# Patient Record
Sex: Female | Born: 1980 | Race: Black or African American | Marital: Single | Smoking: Never smoker
Health system: Southern US, Community
[De-identification: ages and names within clinical notes are randomized; demographics above are authoritative.]

## PROBLEM LIST (undated history)

## (undated) ENCOUNTER — Emergency Department (HOSPITAL_COMMUNITY): Admission: EM | Payer: Self-pay | Source: Home / Self Care

## (undated) HISTORY — PX: KNEE SURGERY: SHX244

---

## 2014-07-20 ENCOUNTER — Emergency Department (HOSPITAL_COMMUNITY)
Admission: EM | Admit: 2014-07-20 | Discharge: 2014-07-20 | Disposition: A | Discharge status: Home / Self Care | Attending: Emergency Medicine | Admitting: Emergency Medicine

## 2014-07-20 DIAGNOSIS — H6123 Impacted cerumen, bilateral: Secondary | ICD-10-CM | POA: Diagnosis not present

## 2014-07-20 DIAGNOSIS — H938X1 Other specified disorders of right ear: Secondary | ICD-10-CM

## 2014-07-20 DIAGNOSIS — H9201 Otalgia, right ear: Secondary | ICD-10-CM | POA: Diagnosis present

## 2014-07-20 MED ORDER — DOCUSATE SODIUM 50 MG/5ML PO LIQD
200.0000 mg | Freq: Once | ORAL | Status: AC
  Administered 2014-07-20: 200 mg via OTIC
  Filled 2014-07-20: qty 20

## 2014-07-20 NOTE — ED Provider Notes (Signed)
CSN: [id number]     Arrival date & time 07/20/14  0358 History   None    No chief complaint on file.    (Consider location/radiation/quality/duration/timing/severity/associated sxs/prior Treatment) Patient is a 34 y.o. female presenting with ear pain. The history is provided by the patient.  Otalgia Location:  Right Quality:  Aching Severity:  Mild Onset quality:  Gradual Duration:  12 hours Timing:  Constant Progression:  Unchanged Chronicity:  New Context: not direct blow and not elevation change   Relieved by:  Nothing Worsened by:  Nothing tried Associated symptoms: no cough and no fever     No past medical history on file. No past surgical history on file. No family history on file. History  Substance Use Topics  . Smoking status: Not on file  . Smokeless tobacco: Not on file  . Alcohol Use: Not on file   OB History    No data available     Review of Systems  Constitutional: Negative for fever.  HENT: Positive for ear pain.   Respiratory: Negative for cough and shortness of breath.   All other systems reviewed and are negative.     Allergies  Review of patient's allergies indicates no known allergies.  Home Medications   Prior to Admission medications   Not on File   There were no vitals taken for this visit. Physical Exam  Constitutional: She is oriented to person, place, and time. She appears well-developed and well-nourished. No distress.  HENT:  Head: Normocephalic and atraumatic.  Mouth/Throat: Oropharynx is clear and moist.  Bilateral ears impacted with cerumen  Eyes: EOM are normal. Pupils are equal, round, and reactive to light.  Neck: Normal range of motion. Neck supple.  Cardiovascular: Normal rate and regular rhythm.  Exam reveals no friction rub.   No murmur heard. Pulmonary/Chest: Effort normal and breath sounds normal. No respiratory distress. She has no wheezes. She has no rales.  Abdominal: Soft. She exhibits no distension. There  is no tenderness. There is no rebound.  Musculoskeletal: Normal range of motion. She exhibits no edema.  Neurological: She is alert and oriented to person, place, and time.  Skin: She is not diaphoretic.  Nursing note and vitals reviewed.   ED Course  Procedures (including critical care time) Labs Review Labs Reviewed - No data to display  Imaging Review No results found.   EKG Interpretation None      MDM   Final diagnoses:  Ear fullness, right    55F here with R ear pain - impacted with cerumen on exam. L ear filled with cerumen also. No fevers, no mastoid tenderness, no URI symptoms. Colace applied and then irrigated. Minimal wax removal, however she reported her ear popped, which is what she wanted. She wants to leave, can get her ears cleaned out by her PCP.  Elwin MochaBlair Brinkley Peet, MD 07/20/14 [phone]

## 2016-01-26 ENCOUNTER — Emergency Department (HOSPITAL_COMMUNITY)
Admission: EM | Admit: 2016-01-26 | Discharge: 2016-01-26 | Disposition: A | Discharge status: Home / Self Care | Attending: Emergency Medicine | Admitting: Emergency Medicine

## 2016-01-26 ENCOUNTER — Emergency Department (HOSPITAL_COMMUNITY)

## 2016-01-26 ENCOUNTER — Encounter (HOSPITAL_COMMUNITY): Payer: Self-pay | Admitting: Emergency Medicine

## 2016-01-26 DIAGNOSIS — R1011 Right upper quadrant pain: Secondary | ICD-10-CM | POA: Diagnosis not present

## 2016-01-26 DIAGNOSIS — R1031 Right lower quadrant pain: Secondary | ICD-10-CM | POA: Diagnosis present

## 2016-01-26 LAB — LIPASE, BLOOD: Lipase: 14 U/L (ref 11–51)

## 2016-01-26 LAB — I-STAT BETA HCG BLOOD, ED (MC, WL, AP ONLY): I-stat hCG, quantitative: <5 m[IU]/mL (ref <5)

## 2016-01-26 MED ORDER — ONDANSETRON HCL 4 MG/2ML IJ SOLN: 4.0000 mg | Freq: Once | INTRAMUSCULAR | Status: DC

## 2016-01-26 MED ORDER — MORPHINE SULFATE (PF) 4 MG/ML IV SOLN: 4.0000 mg | Freq: Once | INTRAVENOUS | Status: DC

## 2016-01-26 MED ORDER — IOPAMIDOL (ISOVUE-300) INJECTION 61%
100.0000 mL | Freq: Once | INTRAVENOUS | Status: AC | PRN
  Administered 2016-01-26: 100 mL via INTRAVENOUS

## 2016-01-26 MED ORDER — SODIUM CHLORIDE 0.9 % IV BOLUS (SEPSIS)
1000.0000 mL | Freq: Once | INTRAVENOUS | Status: AC
  Administered 2016-01-26: 1000 mL via INTRAVENOUS

## 2016-01-26 NOTE — ED Notes (Signed)
Pt states she was seen at her PCP today for periumbilical abdominal pain and was found to have an elevated WBC. States the PCP was worried for appendicitis and was told to come to the ED if her pain progressed. Pain worsens when lying on R side. Alert and oriented.

## 2016-01-26 NOTE — ED Provider Notes (Signed)
CSN: [id number]     Arrival date & time 01/26/16  0100 History  By signing my name below, I, Emmanuella Mensah, attest that this documentation has been prepared under the direction and in the presence of Melene Plan, DO. Electronically Signed: Angelene Giovanni, ED Scribe. 01/26/2016. 1:32 AM.    Chief Complaint  Patient presents with  . Abdominal Pain   Patient is a 35 y.o. female presenting with abdominal pain. The history is provided by the patient. No language interpreter was used.  Abdominal Pain Pain location:  Periumbilical and RLQ Pain severity:  Moderate Onset quality:  Gradual Timing:  Intermittent Progression:  Worsening Chronicity:  New Relieved by:  None tried Worsened by:  Nothing tried Ineffective treatments:  None tried Associated symptoms: no chest pain, no chills, no diarrhea, no dysuria, no fever, no nausea, no shortness of breath and no vomiting    HPI Comments: Meredith Madden is a 35 y.o. female who presents to the Emergency Department complaining of gradually worsening intermittent mid and RLQ abdominal pain onset several days ago. Pt reports associated decreased in appetite. She adds that the pain shifts throughout her abdomen depending on her position. She states that laying down or sitting up makes the pain worse. Pt was seen by her PCP and advised to come to the ED with concerns for appendicitis. She states that she had a negative pregnancy test there but her WBC was elevated. No alleviating factors noted. Pt has not tried any medications PTA. She denies any recent falls, injuries, or trauma. No sick contacts noted. She denies any fever, chills, or n/v/d.    History reviewed. No pertinent past medical history. Past Surgical History  Procedure Laterality Date  . Knee surgery Right    No family history on file. Social History  Substance Use Topics  . Smoking status: Never Smoker   . Smokeless tobacco: None  . Alcohol Use: No   OB History    No data available      Review of Systems  Constitutional: Negative for fever and chills.  HENT: Negative for congestion and rhinorrhea.   Eyes: Negative for redness and visual disturbance.  Respiratory: Negative for shortness of breath and wheezing.   Cardiovascular: Negative for chest pain and palpitations.  Gastrointestinal: Positive for abdominal pain. Negative for nausea, vomiting and diarrhea.  Genitourinary: Negative for dysuria and urgency.  Musculoskeletal: Negative for myalgias and arthralgias.  Skin: Negative for pallor and wound.  Neurological: Negative for dizziness and headaches.  All other systems reviewed and are negative.     Allergies  Review of patient's allergies indicates no known allergies.  Home Medications   Prior to Admission medications   Not on File   BP 111/72 mmHg  Pulse 75  Temp(Src) 98 F (36.7 C) (Oral)  Resp 20  SpO2 100%  LMP 01/06/2016 (Approximate) Physical Exam  Constitutional: She is oriented to person, place, and time. She appears well-developed and well-nourished. No distress.  HENT:  Head: Normocephalic and atraumatic.  Eyes: EOM are normal. Pupils are equal, round, and reactive to light.  Neck: Normal range of motion. Neck supple.  Cardiovascular: Normal rate, regular rhythm and normal heart sounds.  Exam reveals no gallop and no friction rub.   No murmur heard. Pulmonary/Chest: Effort normal and breath sounds normal. She has no wheezes. She has no rales.  Abdominal: Soft. She exhibits no distension. There is tenderness.  Mild RLQ, otherwise benign exam Negative Murphy's sign and negative psoas  Musculoskeletal: Normal range  of motion. She exhibits no edema or tenderness.  Neurological: She is alert and oriented to person, place, and time.  Skin: Skin is warm and dry. She is not diaphoretic.  Psychiatric: She has a normal mood and affect. Her behavior is normal. Judgment normal.  Nursing note and vitals reviewed.   ED Course  Procedures  (including critical care time) DIAGNOSTIC STUDIES: Oxygen Saturation is 100% on RA, normal by my interpretation.    COORDINATION OF CARE: 1:29 AM- Pt advised of plan for treatment and pt agrees. Pt will receive lab work and CT scan for further evaluation. She will receive IV fluids, Morphine, and Zofran.    Labs Review Labs Reviewed  LIPASE, BLOOD  URINALYSIS, ROUTINE W REFLEX MICROSCOPIC (NOT AT [facility])  I-STAT BETA HCG BLOOD, ED ([facility], [facility], [facility] ONLY)    Imaging Review Ct Abdomen Pelvis W Contrast  01/26/2016  CLINICAL DATA:  Right lower quadrant pain for several days. EXAM: CT ABDOMEN AND PELVIS WITH CONTRAST TECHNIQUE: Multidetector CT imaging of the abdomen and pelvis was performed using the standard protocol following bolus administration of intravenous contrast. CONTRAST:  ISOVUE-300 IOPAMIDOL (ISOVUE-300) INJECTION 61% COMPARISON:  None. FINDINGS: Lower chest and abdominal wall:  No contributory findings. Hepatobiliary: No focal liver abnormality.No evidence of biliary obstruction or stone. Pancreas: Unremarkable. Spleen: Unremarkable. Adrenals/Urinary Tract: Negative adrenals. No hydronephrosis or stone. Unremarkable bladder. Stomach/Bowel:  No obstruction. No appendicitis. Reproductive:Fibroid uterus including intramural fundal fibroid measuring nearly 5 cm. Fibroids are enhancing. No noted endometrial distortion. Vascular/Lymphatic: No acute vascular abnormality. Mild enlargement of mesenteric lymph nodes, reactive appearing and often incidental in isolation. Other: Small pelvic fluid, often physiologic in this setting. Musculoskeletal: No acute abnormalities. IMPRESSION: No acute finding, including appendicitis. Fibroid uterus. Electronically Signed   By: Marnee Spring M.D.   On: 01/26/2016 02:52     Melene Plan, DO has personally reviewed and evaluated these images and lab results as part of his medical decision-making.  MDM   Final diagnoses:  Right upper quadrant pain    35  yo F With a chief complaint of abdominal pain. This been going on for a couple days. Patient has had some anorexia with this. Describes the pain is epigastric and periumbilical. Was seen by her family doctor earlier today who thought that this may be appendicitis and told her if it worsened to come to the ED for a CT scan. Patient with mild right lower quadrant tenderness on exam but no other noted areas of pain. Will obtain a CT scan.  CT scan is negative for acute pathology. On my viewing of the images the patient appears to have a large stool burden.  I discussed that this may not be the etiology, though recommended a clean out and see if her symptoms improved.   I personally performed the services described in this documentation, which was scribed in my presence. The recorded information has been reviewed and is accurate.      3:24 AM:  I have discussed the diagnosis/risks/treatment options with the patient and believe the pt to be eligible for discharge home to follow-up with PCP. We also discussed returning to the ED immediately if new or worsening sx occur. We discussed the sx which are most concerning (e.g., sudden worsening pain, fever, inability to tolerate by mouth) that necessitate immediate return. Medications administered to the patient during their visit and any new prescriptions provided to the patient are listed below.  Medications given during this visit Medications  morphine 4  MG/ML injection 4 mg (4 mg Intravenous Refused 01/26/16 0206)  ondansetron (ZOFRAN) injection 4 mg (4 mg Intravenous Refused 01/26/16 0207)  sodium chloride 0.9 % bolus 1,000 mL (0 mLs Intravenous Stopped 01/26/16 0318)  iopamidol (ISOVUE-300) 61 % injection 100 mL (100 mLs Intravenous Contrast Given 01/26/16 0232)    New Prescriptions   No medications on file    The patient appears reasonably screen and/or stabilized for discharge and I doubt any other medical condition or other [facility] requiring further  screening, evaluation, or treatment in the ED at this time prior to discharge.    Melene Planan Briceida Rasberry, DO 01/26/16 [phone]

## 2016-01-26 NOTE — Discharge Instructions (Signed)
Try miralax 8 scoops in 32 oz of fluids.  Stay near a bathroom.  Abdominal Pain, Adult Many things can cause abdominal pain. Usually, abdominal pain is not caused by a disease and will improve without treatment. It can often be observed and treated at home. Your health care provider will do a physical exam and possibly order blood tests and X-rays to help determine the seriousness of your pain. However, in many cases, more time must pass before a clear cause of the pain can be found. Before that point, your health care provider may not know if you need more testing or further treatment. HOME CARE INSTRUCTIONS Monitor your abdominal pain for any changes. The following actions may help to alleviate any discomfort you are experiencing:  Only take over-the-counter or prescription medicines as directed by your health care provider.  Do not take laxatives unless directed to do so by your health care provider.  Try a clear liquid diet (broth, tea, or water) as directed by your health care provider. Slowly move to a bland diet as tolerated. SEEK MEDICAL CARE IF:  You have unexplained abdominal pain.  You have abdominal pain associated with nausea or diarrhea.  You have pain when you urinate or have a bowel movement.  You experience abdominal pain that wakes you in the night.  You have abdominal pain that is worsened or improved by eating food.  You have abdominal pain that is worsened with eating fatty foods.  You have a fever. SEEK IMMEDIATE MEDICAL CARE IF:  Your pain does not go away within 2 hours.  You keep throwing up (vomiting).  Your pain is felt only in portions of the abdomen, such as the right side or the left lower portion of the abdomen.  You pass bloody or black tarry stools. MAKE SURE YOU:  Understand these instructions.  Will watch your condition.  Will get help right away if you are not doing well or get worse.   This information is not intended to replace advice  given to you by your health care provider. Make sure you discuss any questions you have with your health care provider.   Document Released: 04/03/2005 Document Revised: 03/15/2015 Document Reviewed: 03/03/2013 Elsevier Interactive Patient Education [employer]2016 [employer] Inc[employer]

## 2017-07-12 IMAGING — CT CT ABD-PELV W/ CM
2 of 4 series · 16 of 46 positions shown, 18 images · IV contrast (ISOVUE)
Comparison: None.

CLINICAL DATA: Right lower quadrant pain for several days.

EXAM:
CT ABDOMEN AND PELVIS WITH CONTRAST
TECHNIQUE: Multidetector CT imaging of the abdomen and pelvis was performed
using the standard protocol following bolus administration of
intravenous contrast.
CONTRAST:  100mL [id number] IOPAMIDOL (6LXV76-Q22) INJECTION 61%

[Series 2: abd/pel with · axial · 0.65mm/px · z∈[+970,+1360]mm · 13 of 88 slices shown, 15 images]
[im 5/88  soft-tissue]
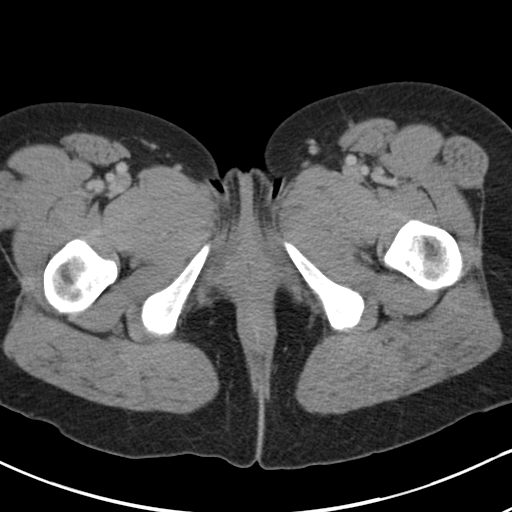
[im 5/88  bone]
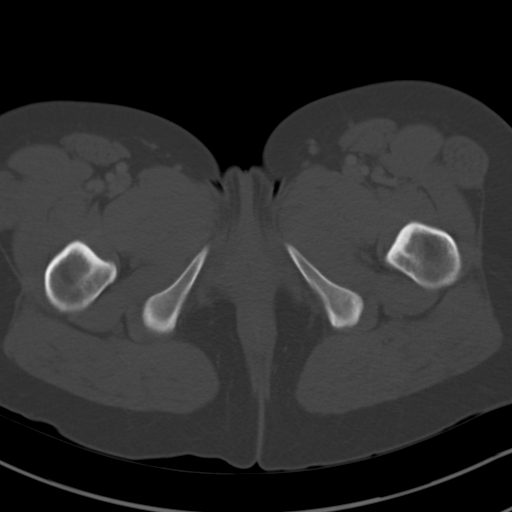
[im 13/88  soft-tissue]
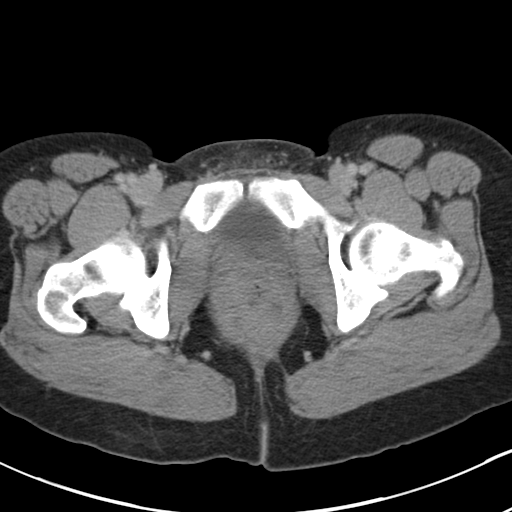
[im 17/88  soft-tissue]
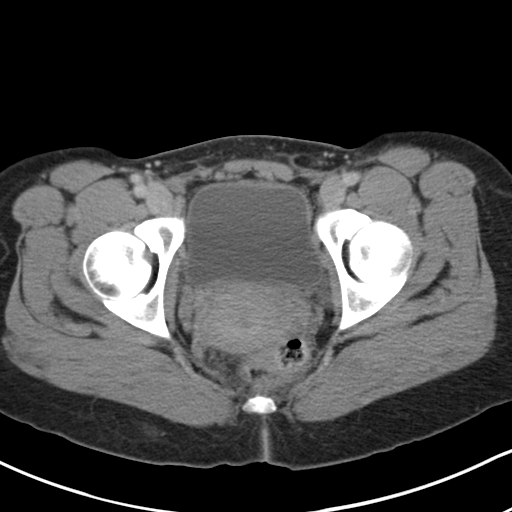
[im 25/88  soft-tissue]
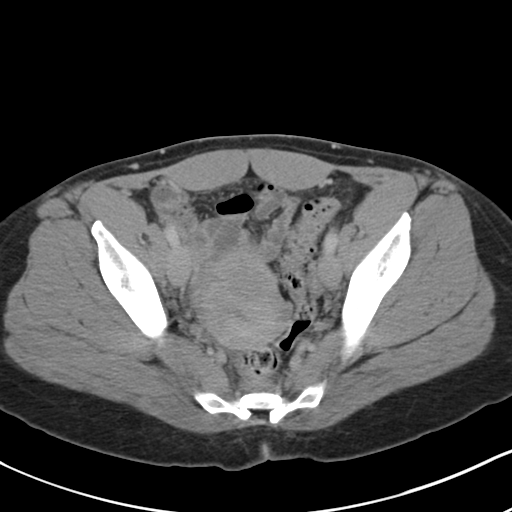
[im 30/88  soft-tissue]
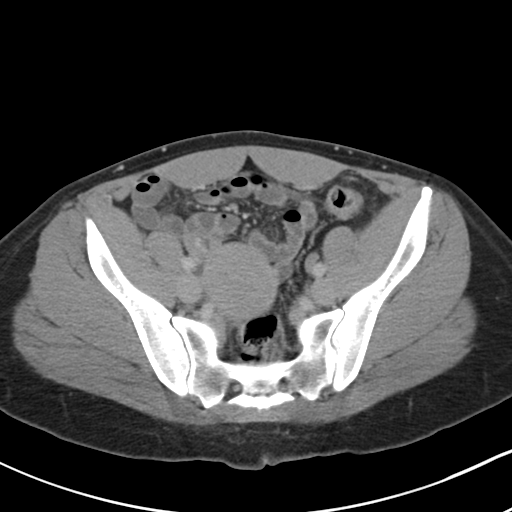
[im 38/88  soft-tissue]
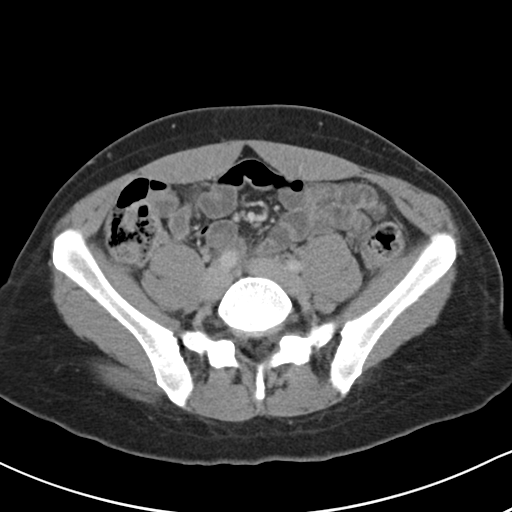
[im 46/88  soft-tissue]
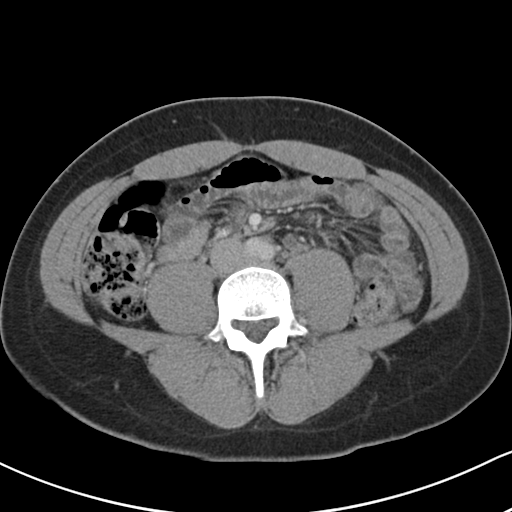
[im 50/88  soft-tissue]
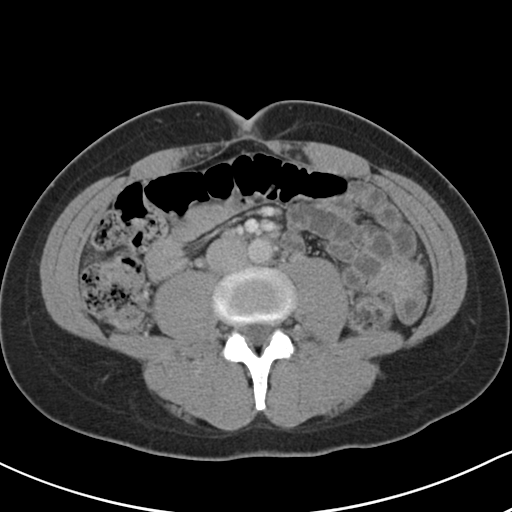
[im 59/88  soft-tissue]
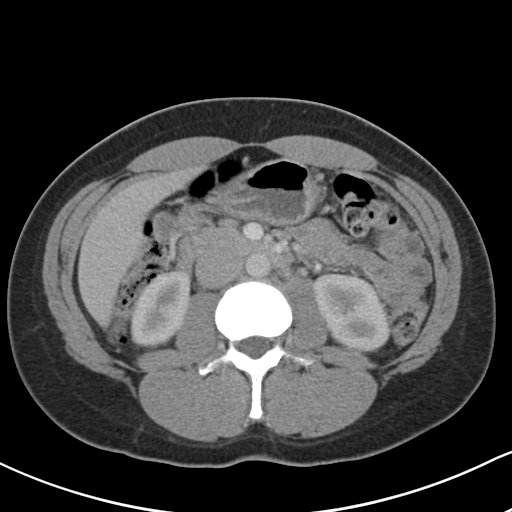
[im 59/88  bone]
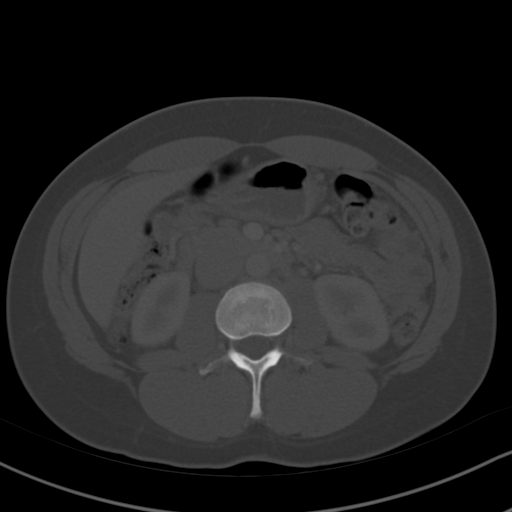
[im 63/88  soft-tissue]
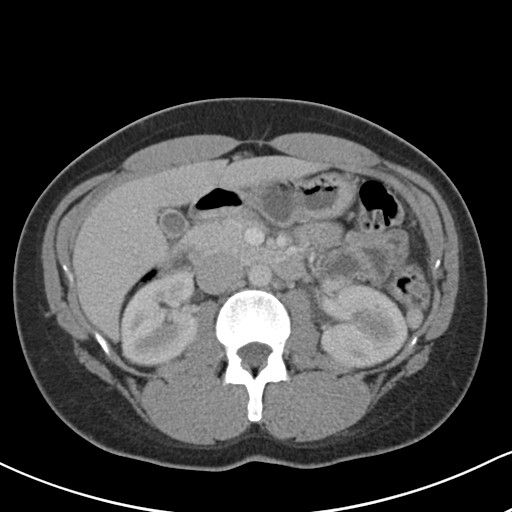
[im 71/88  soft-tissue]
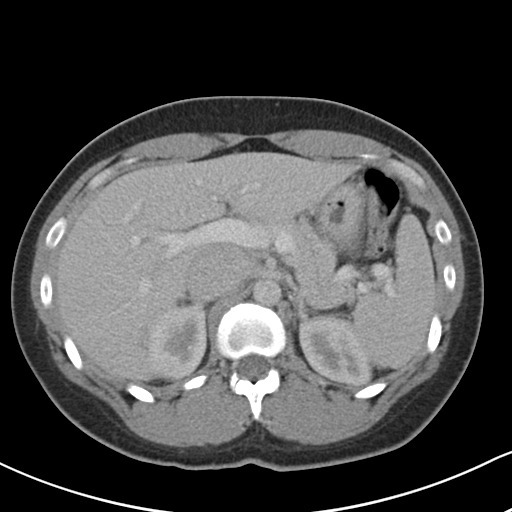
[im 75/88  soft-tissue]
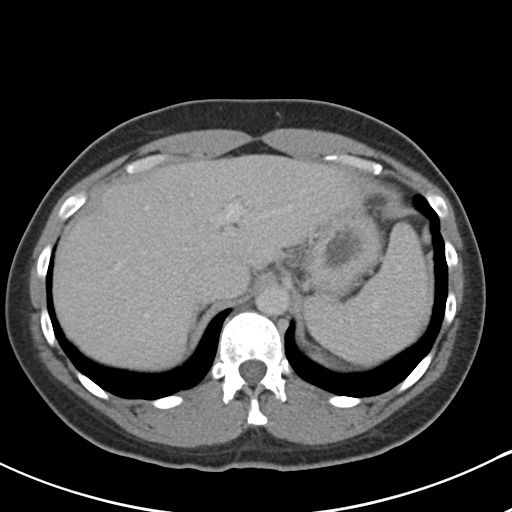
[im 83/88  soft-tissue]
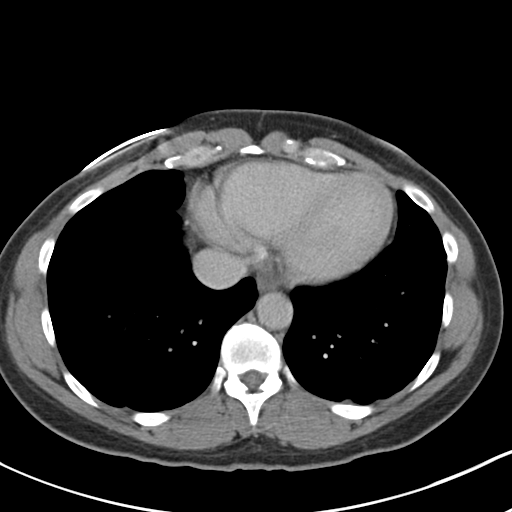

[Series 4: coronal a/|p · coronal · 0.60mm/px · 3 of 105 slices shown]
[im 35/105  soft-tissue]
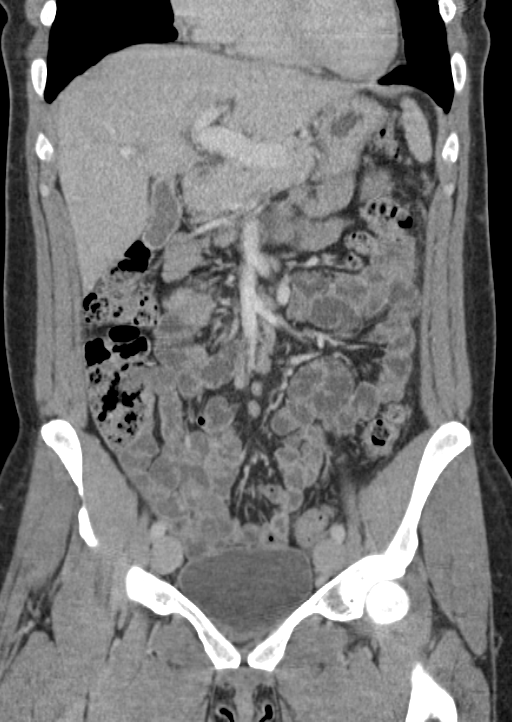
[im 47/105  soft-tissue]
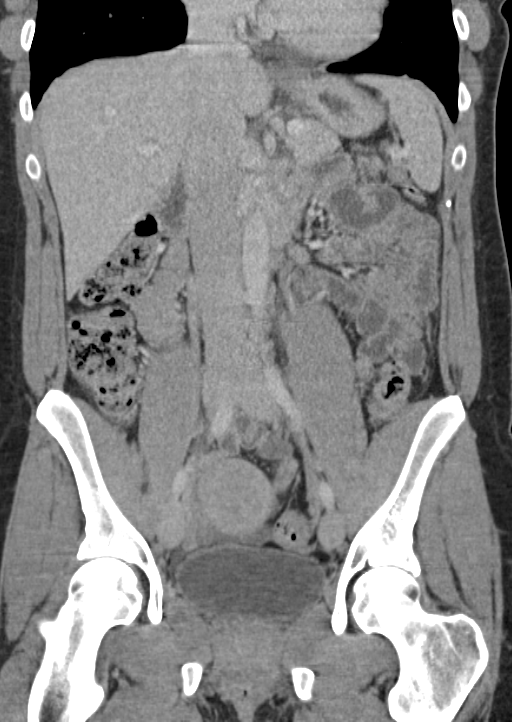
[im 58/105  soft-tissue]
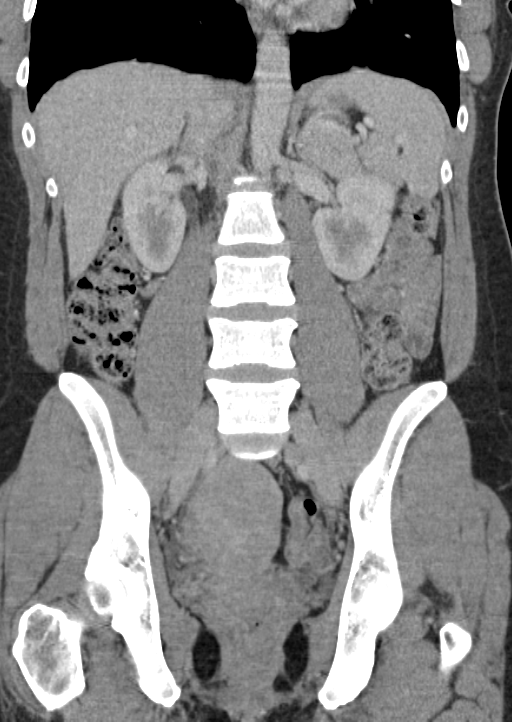

[16 of 46 positions shown; findings below may reference images not displayed]

FINDINGS: Lower chest and abdominal wall:  No contributory findings.

Hepatobiliary: No focal liver abnormality.No evidence of biliary
obstruction or stone.

Pancreas: Unremarkable.

Spleen: Unremarkable.

Adrenals/Urinary Tract: Negative adrenals. No hydronephrosis or
stone. Unremarkable bladder.

Stomach/Bowel:  No obstruction. No appendicitis.

Reproductive:Fibroid uterus including intramural fundal fibroid
measuring nearly 5 cm. Fibroids are enhancing. No noted endometrial
distortion.

Vascular/Lymphatic: No acute vascular abnormality. Mild enlargement
of mesenteric lymph nodes, reactive appearing and often incidental
in isolation.

Other: Small pelvic fluid, often physiologic in this setting.

Musculoskeletal: No acute abnormalities.
IMPRESSION: No acute finding, including appendicitis.

Fibroid uterus.

## 2023-12-31 ENCOUNTER — Other Ambulatory Visit: Payer: Self-pay | Admitting: Family Medicine

## 2023-12-31 ENCOUNTER — Ambulatory Visit
Admission: RE | Admit: 2023-12-31 | Discharge: 2023-12-31 | Disposition: A | Discharge status: Home / Self Care | Source: Ambulatory Visit | Attending: Family Medicine | Admitting: Family Medicine

## 2023-12-31 DIAGNOSIS — M545 Low back pain, unspecified: Secondary | ICD-10-CM

## 2023-12-31 DIAGNOSIS — R109 Unspecified abdominal pain: Secondary | ICD-10-CM

## 2023-12-31 DIAGNOSIS — M542 Cervicalgia: Secondary | ICD-10-CM

## 2024-01-16 ENCOUNTER — Other Ambulatory Visit: Payer: Self-pay | Admitting: Family Medicine

## 2024-01-16 DIAGNOSIS — N23 Unspecified renal colic: Secondary | ICD-10-CM

## 2024-01-16 DIAGNOSIS — N10 Acute tubulo-interstitial nephritis: Secondary | ICD-10-CM

## 2024-01-19 ENCOUNTER — Ambulatory Visit
Admission: RE | Admit: 2024-01-19 | Discharge: 2024-01-19 | Disposition: A | Discharge status: Home / Self Care | Source: Ambulatory Visit | Attending: Family Medicine | Admitting: Family Medicine

## 2024-01-19 DIAGNOSIS — N10 Acute tubulo-interstitial nephritis: Secondary | ICD-10-CM

## 2024-01-19 DIAGNOSIS — N23 Unspecified renal colic: Secondary | ICD-10-CM

## 2024-01-21 ENCOUNTER — Other Ambulatory Visit: Payer: Self-pay | Admitting: Family Medicine

## 2024-01-21 DIAGNOSIS — R1012 Left upper quadrant pain: Secondary | ICD-10-CM

## 2024-01-21 DIAGNOSIS — R310 Gross hematuria: Secondary | ICD-10-CM

## 2024-01-30 ENCOUNTER — Inpatient Hospital Stay: Admission: RE | Admit: 2024-01-30 | Source: Ambulatory Visit

## 2024-05-20 ENCOUNTER — Other Ambulatory Visit: Payer: Self-pay | Admitting: Medical Genetics

## 2024-06-02 ENCOUNTER — Ambulatory Visit: Payer: Self-pay | Admitting: Internal Medicine

## 2024-06-02 ENCOUNTER — Encounter: Payer: Self-pay | Admitting: Internal Medicine

## 2024-06-02 VITALS — BP 112/68 | HR 66 | Temp 98.6°F | Ht 68.5 in | Wt 202.0 lb

## 2024-06-02 DIAGNOSIS — J3089 Other allergic rhinitis: Secondary | ICD-10-CM

## 2024-06-02 DIAGNOSIS — J393 Upper respiratory tract hypersensitivity reaction, site unspecified: Secondary | ICD-10-CM | POA: Diagnosis not present

## 2024-06-02 MED ORDER — CETIRIZINE HCL 10 MG PO TABS
10.0000 mg | ORAL_TABLET | Freq: Every day | ORAL | 5 refills | Status: AC
Start: 2024-06-02 — End: ?

## 2024-06-02 MED ORDER — FLUTICASONE PROPIONATE 50 MCG/ACT NA SUSP: 2.0000 | Freq: Every day | NASAL | 5 refills | Status: AC

## 2024-06-02 NOTE — Progress Notes (Signed)
 NEW PATIENT  Date of Service/Encounter:  06/02/24  Consult requested by: Sebastian Jerilyn HERO, FNP   Subjective:   Meredith Madden (DOB: 02-20-1981) is a 43 y.o. female who presents to the clinic on 06/02/2024 with a chief complaint of Allergic Reaction (Throat closing a couple times. Lemonade and tropical drinks. Started in September from a drink. Took Benadryl. ) and Eczema .    History obtained from: chart review and patient.   Food Reactions:  Started this year.  Eats something and has feeling throat closing; nervous and feels trouble breathing/no trouble talking.  Notes it with lemonade and tropical drinks  Went to [facility] and took benadryl- made symptoms bearable  No other symptoms of hives, visible swelling, vomiting, diarrhea, wheezing, coughing.  Eats lamb; no hx of tick bites  Tickle in throat with peanut and eggs   Rhinitis:  Started since many years  Symptoms include: nasal congestion, rhinorrhea, post nasal drainage, and sneezing, throat clearing Occurs year around but worse Winter Potential triggers: not sure  Treatments tried:  PRN OTC in the past   Previous allergy testing: no History of sinus surgery: no Nonallergic triggers: none   Rashes:  Told she has psoriasis.  Recently started with dry patches Areas that flare commonly are neck, elbow and hands. Current regimen: [place], [place]    Reports use of fragrance/dye containing products Identified triggers of flares include not sure Sleep is not affected   Reviewed:  05/18/2024: Referred by Sebastian NP for allergy injections to test for type of food allergies she has  02/18/2023: seen by [place] PM for plantar fascitis, discussed orthotics.  Past Medical History: History reviewed. No pertinent past medical history.   Past Surgical History: Past Surgical History:  Procedure Laterality Date   KNEE SURGERY Right     Family History: Family History  Problem Relation Age of Onset    Allergic rhinitis Mother    Lupus Maternal Aunt        possible   Eczema Neg Hx    Angioedema Neg Hx    Urticaria Neg Hx     Social History:  Flooring in bedroom: laminate Pets: none Tobacco use/exposure: none   Medication List:  Allergies as of 06/02/2024       Reactions   Influenza Vaccines Dermatitis, Swelling        Medication List    as of June 02, 2024  9:27 AM   You have not been prescribed any medications.      REVIEW OF SYSTEMS: Pertinent positives and negatives discussed in HPI.   Objective:   Physical Exam: BP 112/68   Pulse 66   Temp 98.6 F (37 C) (Temporal)   Ht 5' 8.5 (1.74 m)   Wt 202 lb (91.6 kg)   SpO2 98%   BMI 30.27 kg/m  Body mass index is 30.27 kg/m. GEN: alert, well developed HEENT: clear conjunctiva, nose with + mild inferior turbinate hypertrophy, pink nasal mucosa, slight clear rhinorrhea, + cobblestoning HEART: regular rate and rhythm, no murmur LUNGS: clear to auscultation bilaterally, no coughing, unlabored respiration ABDOMEN: soft, non distended  SKIN: no rashes or lesions   Assessment:   1. Upper respiratory tract hypersensitivity reaction   2. Other allergic rhinitis     Plan/Recommendations:   Food Reactions - Sxs: throat closing with eating  - Will test for commonly allergenic foods at next visit. - Will also test for tryptase and alpha gal.  - I am not convinced these are allergic  in nature.  Will consider possible VCD.   Other Allergic Rhinitis: - Due to turbinate hypertrophy, seasonal  flare ups and unresponsive to over the counter meds, will perform skin testing to identify aeroallergen triggers.   - Use nasal saline rinses before nose sprays such as with Neilmed Sinus Rinse.  Use distilled water.   - Use Flonase  1-2 sprays each nostril daily. Aim upward and outward. - Use Zyrtec  10 mg daily.    Hold all anti-histamines (Xyzal, Allegra, Zyrtec , Claritin, Benadryl, Pepcid) 3 days prior to next  visit.  Follow up: 12/23 at 9 AM for skin testing 1-68, no IDs   Arleta Blanch, MD [facility] 

## 2024-06-02 NOTE — Patient Instructions (Addendum)
 Food Reactions - Will test for commonly allergenic foods at next visit. - Will also test for tryptase and alpha gal.   Other Allergic Rhinitis: - Use nasal saline rinses before nose sprays such as with Neilmed Sinus Rinse.  Use distilled water.   - Use Flonase  1-2 sprays each nostril daily. Aim upward and outward. - Use Zyrtec  10 mg daily.    Hold all anti-histamines (Xyzal, Allegra, Zyrtec , Claritin, Benadryl, Pepcid) 3 days prior to next visit.  Follow up: 12/23 at 9 AM for skin testing 1-68

## 2024-06-08 LAB — ALPHA-GAL PANEL
Allergen Lamb IgE: <0.1 kU/L
Beef IgE: <0.1 kU/L
IgE (Immunoglobulin E), Serum: 29 [IU]/mL (ref 6–495)
O215-IgE Alpha-Gal: <0.1 kU/L
Pork IgE: <0.1 kU/L

## 2024-06-08 LAB — TRYPTASE: Tryptase: 3.2 ug/L (ref 2.2–13.2)

## 2024-06-29 ENCOUNTER — Ambulatory Visit: Admitting: Internal Medicine

## 2024-07-06 ENCOUNTER — Ambulatory Visit (INDEPENDENT_AMBULATORY_CARE_PROVIDER_SITE_OTHER): Admitting: Internal Medicine

## 2024-07-06 DIAGNOSIS — J393 Upper respiratory tract hypersensitivity reaction, site unspecified: Secondary | ICD-10-CM | POA: Diagnosis not present

## 2024-07-06 DIAGNOSIS — J301 Allergic rhinitis due to pollen: Secondary | ICD-10-CM | POA: Diagnosis not present

## 2024-07-06 DIAGNOSIS — J3089 Other allergic rhinitis: Secondary | ICD-10-CM | POA: Diagnosis not present

## 2024-07-06 NOTE — Progress Notes (Signed)
 "  FOLLOW UP Date of Service/Encounter:  07/06/2024   Subjective:  Meredith Madden (DOB: 01-Apr-1981) is a 43 y.o. female who [facility] for follow up for skin testing.   History obtained from: chart review and patient.  Anti histamines held.   Past Medical History: No past medical history on file.  Objective:  There were no vitals taken for this visit. There is no height or weight on file to calculate BMI. Physical Exam: GEN: alert, well developed HEENT: clear conjunctiva, MMM LUNGS: unlabored respiration  Skin Testing:  Skin prick testing was placed, which includes aeroallergens/foods, histamine control, and saline control.  Verbal consent was obtained prior to placing test.  Patient tolerated procedure well.  Allergy testing results were read and interpreted by myself, documented by clinical staff. Adequate positive and negative control.  Positive results to:  Results discussed with patient/family.  Airborne Adult Perc - 07/06/24 0947     Time Antigen Placed 9057    Allergen Manufacturer Jestine    Location Back    Number of Test 55    1. Control-Buffer 50% Glycerol Negative    2. Control-Histamine 3+    3. Bahia 2+    4. Bermuda 3+    5. Johnson 3+    6. [place]  Blue 3+    7. Meadow Fescue 3+    8. Perennial Rye 3+    9. Timothy 3+    10. Ragweed Mix Negative    11. Cocklebur 2+    12. Plantain,  English 3+    13. Baccharis Negative    14. Dog Fennel Negative    15. Russian Thistle 2+    16. Lamb's Quarters 2+    17. Sheep Sorrell Negative    18. Rough Pigweed Negative    19. Marsh Elder, Rough 2+    20. Mugwort, Common Negative    21. Box, Elder Negative    22. Cedar, red Negative    23. Sweet Gum Negative    24. Pecan Pollen Negative    25. Pine Mix Negative    26. Walnut, Black Pollen Negative    27. Red Mulberry Negative    28. Ash Mix Negative    29. Birch Mix Negative    30. Beech American Negative     31. Cottonwood, Eastern Negative    32. Hickory, White Negative    33. Maple Mix Negative    34. Oak, Eastern Mix Negative    35. Sycamore Eastern Negative    36. Alternaria Alternata Negative    37. Cladosporium Herbarum Negative    38. Aspergillus Mix Negative    39. Penicillium Mix Negative    40. Bipolaris Sorokiniana (Helminthosporium) Negative    41. Drechslera Spicifera (Curvularia) Negative    42. Mucor Plumbeus Negative    43. Fusarium Moniliforme Negative    44. Aureobasidium Pullulans (pullulara) Negative    45. Rhizopus Oryzae Negative    46. Botrytis Cinera Negative    47. Epicoccum Nigrum Negative    48. Phoma Betae Negative    49. Dust Mite Mix 3+    50. Cat Hair 10,000 BAU/ml Negative    51.  Dog Epithelia Negative    52. Mixed Feathers Negative    53. Horse Epithelia Negative    54. Cockroach, German Negative    55. Tobacco Leaf Negative          13 Food Perc - 07/06/24 [phone]  Test Information   Time Antigen Placed [phone]    Allergen Manufacturer Jestine    Location Back    Number of allergen test 13      Food   1. Peanut Negative    2. Soybean Negative    3. Wheat Negative    4. Sesame Negative    5. Milk, Cow Negative    6. Casein Negative    7. Egg White, Chicken Negative    8. Shellfish Mix Negative    9. Fish Mix Negative    10. Cashew Negative    11. Walnut Food Negative    12. Almond Negative    13. Hazelnut Negative           Assessment:   1. Seasonal allergic rhinitis due to pollen   2. Upper respiratory tract hypersensitivity reaction   3. Allergic rhinitis due to dust mite     Plan/Recommendations:  Reactions:  - Sxs: sensation of throat closing; usually with eating. - 05/2024: normal alpha gal and tryptase; SPT 06/2024: negative to commonly allergenic foods  - VCD exercises as below. Can consider ENT referral if persistent.    Allergic Rhinitis: - Due to turbinate hypertrophy, seasonal symptoms and unresponsive to  over the counter meds, will perform skin testing to identify aeroallergen triggers.    Positive skin test 06/2024: grasses, weeds, dust mites  - Avoidance measures discussed. - Use nasal saline rinses before nose sprays such as with Neilmed Sinus Rinse.  Use distilled water.   - Use Flonase  2 sprays each nostril daily. Aim upward and outward. - Use Zyrtec  10 mg daily.  - Consider allergy shots as long term control of your symptoms by teaching your immune system to be more tolerant of your allergy triggers  Vocal Cord Dysfunction Breathing Exercises Use these breathing techniques at any sign of shortness of breath, wheezing, tightness or stridor/noisy breathing. If this occurs during activity, stop activity, do exercise until it stops and then resume activity gradually. Remember Tightness or stridor can be released by breathing exercises Do exercises easily - don't push shoulders or chest Concentrate on letting air in and out Go into new activities and sports gradually Diaphragmatic breathing exercises Do 10 cycles X3 Practice 2-3 times per day, lying down and sitting up & standing Concentrate on deep diaphragmatic breathing; relaxation of the entire upper body; and increased breath capacity Practice in a quiet environment to help encourage focus Have adult supervision until child can practice with ease on their own Once good breathing practice is established, practice more frequently throughout the day Review your mental checklist during breathing exercises Are my face, jaw, tongue relaxed? Is my throat open and relaxed? Are my shoulders relaxed and not moving? Is my chest relaxed and not moving? Is my diaphragm doing all the work: moving out for inhalation and in for exhalation? Is my breathing rate slow and rhythmic? Is my breath full and relaxed (can count to 2 seconds on inhalation and 4-5 seconds on exhalation) Swallow-breathe technique Swallow followed by exhalation and  initiation of diaphragmatic breathing. Do 10 full cycles of inhale/exhale. Continue with multiple cycles if needed, until the vocal cord dysfunction goes away. If a vocal cord dysfunction event refuses to be suppressed with this technique, analyze the problem more fully and consult medical assistance, if needed. Relaxed throat breath Do 5 of these relaxed throat breaths in the morning, at noon, before bedtime, before medications, as needed. Hand on abdomen (above the belt or both) when  needed Inhale into abdomen- abdomen comes out Exhale from abdomen-abdomen comes in Inhale with relaxed throat: Tongue on floor of mouth Lips gently closed Jaw gently released Exhale      Return in about 3 months (around 10/04/2024).  Arleta Blanch, MD [facility]        "

## 2024-07-06 NOTE — Patient Instructions (Addendum)
 Food Reactions - Sxs: sensation of throat closing  - 05/2024: normal alpha gal and tryptase; SPT 06/2024: negative to commonly allergnic foods  - VCD exercises as below.    Allergic Rhinitis: - Positive skin test 06/2024: grasses, weeds, dust mites  - Use nasal saline rinses before nose sprays such as with Neilmed Sinus Rinse.  Use distilled water.   - Use Flonase  2 sprays each nostril daily. Aim upward and outward. - Use Zyrtec  10 mg daily.  - Consider allergy shots as long term control of your symptoms by teaching your immune system to be more tolerant of your allergy triggers   ALLERGEN AVOIDANCE MEASURES   Dust Mites Use central air conditioning and heat; and change the filter monthly.  Pleated filters work better than mesh filters.  Electrostatic filters may also be used; wash the filter monthly.  Window air conditioners may be used, but do not clean the air as well as a central air conditioner.  Change or wash the filter monthly. Keep windows closed.  Do not use attic fans.   Encase the mattress, box springs and pillows with zippered, dust proof covers. Wash the bed linens in hot water weekly.   Remove carpet, especially from the bedroom. Remove stuffed animals, throw pillows, dust ruffles, heavy drapes and other items that collect dust from the bedroom. Do not use a humidifier.   Use wood, vinyl or leather furniture instead of cloth furniture in the bedroom. Keep the indoor humidity at 30 - 40%.   Pollen Avoidance Pollen levels are highest during the mid-day and afternoon.  Consider this when planning outdoor activities. Avoid being outside when the grass is being mowed, or wear a mask if the pollen-allergic person must be the one to mow the grass. Keep the windows closed to keep pollen outside of the home. Use an air conditioner to filter the air. Take a shower, wash hair, and change clothing after working or playing outdoors during pollen season.   Vocal Cord Dysfunction  Breathing Exercises Use these breathing techniques at any sign of shortness of breath, wheezing, tightness or stridor/noisy breathing. If this occurs during activity, stop activity, do exercise until it stops and then resume activity gradually. Remember Tightness or stridor can be released by breathing exercises Do exercises easily - don't push shoulders or chest Concentrate on letting air in and out Go into new activities and sports gradually Diaphragmatic breathing exercises Do 10 cycles X3 Practice 2-3 times per day, lying down and sitting up & standing Concentrate on deep diaphragmatic breathing; relaxation of the entire upper body; and increased breath capacity Practice in a quiet environment to help encourage focus Have adult supervision until child can practice with ease on their own Once good breathing practice is established, practice more frequently throughout the day Review your mental checklist during breathing exercises Are my face, jaw, tongue relaxed? Is my throat open and relaxed? Are my shoulders relaxed and not moving? Is my chest relaxed and not moving? Is my diaphragm doing all the work: moving out for inhalation and in for exhalation? Is my breathing rate slow and rhythmic? Is my breath full and relaxed (can count to 2 seconds on inhalation and 4-5 seconds on exhalation) Swallow-breathe technique Swallow followed by exhalation and initiation of diaphragmatic breathing. Do 10 full cycles of inhale/exhale. Continue with multiple cycles if needed, until the vocal cord dysfunction goes away. If a vocal cord dysfunction event refuses to be suppressed with this technique, analyze the problem more fully  and consult medical assistance, if needed. Relaxed throat breath Do 5 of these relaxed throat breaths in the morning, at noon, before bedtime, before medications, as needed. Hand on abdomen (above the belt or both) when needed Inhale into abdomen- abdomen comes  out Exhale from abdomen-abdomen comes in Inhale with relaxed throat: Tongue on floor of mouth Lips gently closed Jaw gently released Exhale
